# Patient Record
Sex: Male | Born: 1990 | Race: White | Hispanic: No | Marital: Married | Smoking: Never smoker
Health system: Southern US, Community
[De-identification: ages and names within clinical notes are randomized; demographics above are authoritative.]

---
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
## 2019-02-16 ENCOUNTER — Ambulatory Visit: Payer: Self-pay | Attending: Internal Medicine

## 2020-06-21 ENCOUNTER — Ambulatory Visit
Admission: RE | Admit: 2020-06-21 | Discharge: 2020-06-21 | Disposition: A | Discharge status: Home / Self Care | Payer: 59 | Source: Ambulatory Visit | Attending: Emergency Medicine | Admitting: Emergency Medicine

## 2020-06-21 ENCOUNTER — Other Ambulatory Visit: Payer: Self-pay

## 2020-06-21 VITALS — BP 134/90 | HR 68 | Temp 98.6°F | Resp 18

## 2020-06-21 DIAGNOSIS — R42 Dizziness and giddiness: Secondary | ICD-10-CM

## 2020-06-21 LAB — POCT FASTING CBG KUC MANUAL ENTRY: POCT Glucose (KUC): 85 mg/dL (ref 70–99)

## 2020-06-21 NOTE — Discharge Instructions (Signed)
Go to the emergency department if you have acute worsening symptoms.    Follow-up with your primary care provider tomorrow.

## 2020-06-21 NOTE — ED Triage Notes (Signed)
Pt presents with intermittent shaking, dizziness, nausea x 6 days.  States he took a Dramamine yesterday which has significantly helped with the dizziness.  Has increased fluid intake but no improvement.  For a short time yesterday during one of these episodes, feel like his arms were somewhat numb.  Feels weak today.

## 2020-06-21 NOTE — ED Provider Notes (Signed)
Louis Ray    CSN: [id number] Arrival date & time: 06/21/20  1255      History   Chief Complaint Chief Complaint  Patient presents with  . Shaking  . Nausea  . Dizziness    HPI Louis Ray is a 30 y.o. male.   Patient presents with intermittent episodes of lightheadedness and shakiness x6 days.  Last episode occurred yesterday.  He states the feeling is like being "off balance."  No sensation of room spinning.  He states he had some tingling and numbness in his fingers yesterday during his symptoms but this resolved.  He denies focal weakness, chest pain, shortness of breath, abdominal pain, diaphoresis, vomiting, or other symptoms.  Treatment attempted yesterday with Dramamine which improved his symptoms.  He also has increased his water and food intake.  He denies pertinent medical history.    The history is provided by the patient.    History reviewed. No pertinent past medical history.  There are no problems to display for this patient.   History reviewed. No pertinent surgical history.     Home Medications    Prior to Admission medications   Medication Sig Start Date End Date Taking? Authorizing Provider  fexofenadine (ALLEGRA) 60 MG tablet Take 60 mg by mouth 2 (two) times daily.   Yes [provider]    Family History Family History  Problem Relation Age of Onset  . Healthy Mother   . Healthy Father     Social History Social History   Tobacco Use  . Smoking status: Never Smoker  . Smokeless tobacco: Never Used  Vaping Use  . Vaping Use: Never used  Substance Use Topics  . Alcohol use: Never  . Drug use: Never     Allergies   Omnicef [cefdinir]   Review of Systems Review of Systems  Constitutional: Negative for chills and fever.  Respiratory: Negative for cough and shortness of breath.   Cardiovascular: Negative for chest pain and palpitations.  Gastrointestinal: Negative for abdominal pain, diarrhea, nausea and  vomiting.  Musculoskeletal: Negative for arthralgias and back pain.  Skin: Negative for color change and rash.  Neurological: Positive for light-headedness and numbness. Negative for dizziness, seizures, syncope, facial asymmetry, speech difficulty, weakness and headaches.  All other systems reviewed and are negative.    Physical Exam Triage Vital Signs ED Triage Vitals  Enc Vitals Group     BP 06/21/20 1322 134/90     Pulse Rate 06/21/20 1322 68     Resp 06/21/20 1322 18     Temp 06/21/20 1322 98.6 F (37 C)     Temp Source 06/21/20 1322 Oral     SpO2 06/21/20 1322 97 %     Weight --      Height --      Head Circumference --      Peak Flow --      Pain Score 06/21/20 1319 0     Pain Loc --      Pain Edu? --      Excl. in GC? --    No data found.  Updated Vital Signs BP 134/90 (BP Location: Left Arm)   Pulse 68   Temp 98.6 F (37 C) (Oral)   Resp 18   SpO2 97%   Visual Acuity Right Eye Distance:   Left Eye Distance:   Bilateral Distance:    Right Eye Near:   Left Eye Near:    Bilateral Near:  Physical Exam Vitals and nursing note reviewed.  Constitutional:      General: He is not in acute distress.    Appearance: He is well-developed. He is not ill-appearing.  HENT:     Head: Normocephalic and atraumatic.     Mouth/Throat:     Mouth: Mucous membranes are moist.  Eyes:     Conjunctiva/sclera: Conjunctivae normal.  Cardiovascular:     Rate and Rhythm: Normal rate and regular rhythm.     Heart sounds: Normal heart sounds.  Pulmonary:     Effort: Pulmonary effort is normal. No respiratory distress.     Breath sounds: Normal breath sounds.  Abdominal:     Palpations: Abdomen is soft.     Tenderness: There is no abdominal tenderness.  Musculoskeletal:     Cervical back: Neck supple.  Skin:    General: Skin is warm and dry.  Neurological:     General: No focal deficit present.     Mental Status: He is alert and oriented to person, place, and time.      Sensory: No sensory deficit.     Motor: No weakness.     Gait: Gait normal.  Psychiatric:        Mood and Affect: Mood normal.        Behavior: Behavior normal.      UC Treatments / Results  Labs (all labs ordered are listed, but only abnormal results are displayed) Labs Reviewed  POCT FASTING CBG [facility] MANUAL ENTRY    EKG   Radiology No results found.  Procedures Procedures (including critical care time)  Medications Ordered in UC Medications - No data to display  Initial Impression / Assessment and Plan / UC Course  I have reviewed the triage vital signs and the nursing notes.  Pertinent labs & imaging results that were available during my care of the patient were reviewed by me and considered in my medical decision making (see chart for details).   Lightheadedness.  Patient is well-appearing and currently asymptomatic.  His vital signs are stable and his exam is reassuring.  EKG shows sinus rhythm, rate 73, no ST elevation, compared to previous from 2009; T wave inversion in aVL but this was present in previous EKG from 2009.  CBG 85.  Strict ED precautions given to patient and instructed him to follow-up with his PCP tomorrow.  Education provided on lightheadedness.  Encouraged him to continue to make sure he is staying hydrated and eating regularly.  He agrees to plan of care.   Final Clinical Impressions(s) / UC Diagnoses   Final diagnoses:  Lightheadedness     Discharge Instructions     Go to the emergency department if you have acute worsening symptoms.    Follow-up with your primary care provider tomorrow.        ED Prescriptions    None     PDMP not reviewed this encounter.   Mickie Bail, NP 06/21/20 1435
